# Patient Record
Sex: Male | Born: 1980 | Race: Black or African American | Hispanic: No | Marital: Single | Smoking: Current every day smoker
Health system: Southern US, Community
[De-identification: ages and names within clinical notes are randomized; demographics above are authoritative.]

---

## 2010-07-23 ENCOUNTER — Ambulatory Visit: Payer: Self-pay | Admitting: Internal Medicine

## 2010-08-19 ENCOUNTER — Ambulatory Visit: Payer: Self-pay | Admitting: Internal Medicine

## 2016-05-20 ENCOUNTER — Ambulatory Visit
Admission: EM | Admit: 2016-05-20 | Discharge: 2016-05-20 | Disposition: A | Discharge status: Home / Self Care | Payer: BC Managed Care – PPO | Attending: Family Medicine | Admitting: Family Medicine

## 2016-05-20 ENCOUNTER — Encounter: Payer: Self-pay | Admitting: Emergency Medicine

## 2016-05-20 ENCOUNTER — Ambulatory Visit (INDEPENDENT_AMBULATORY_CARE_PROVIDER_SITE_OTHER): Payer: BC Managed Care – PPO

## 2016-05-20 DIAGNOSIS — S9032XA Contusion of left foot, initial encounter: Secondary | ICD-10-CM | POA: Diagnosis not present

## 2016-05-20 NOTE — Discharge Instructions (Signed)
Use ice and elevation 20 minutes out of every 2 hours.23 times a day. Use boot for ambulation for activities but may come out of the boot for quiet times. If you're not improving in a week or two follow-up with the orthopedic surgeon

## 2016-05-20 NOTE — ED Triage Notes (Signed)
Patient states that he fell off a hover board and hurt his left foot today.

## 2016-05-20 NOTE — ED Provider Notes (Signed)
CSN: [id number]     Arrival date & time 05/20/16  1850 History   First MD Initiated Contact with Patient 05/20/16 2000     Chief Complaint  Patient presents with  . Foot Pain    left foot   (Consider location/radiation/quality/duration/timing/severity/associated sxs/prior Treatment) HPI  Sit 35 year old male who was using his son's hover board today when he lost control and fell onto his left lateral foot while wearing a boot. He said he's had difficulty walking since that period of time. His pain is over the lateral aspect of his foot indicating the base of his fifth metatarsal.     History reviewed. No pertinent past medical history. History reviewed. No pertinent surgical history. Family History  Problem Relation Age of Onset  . Healthy Mother   . Emphysema Father    Social History  Substance Use Topics  . Smoking status: Current Every Day Smoker  . Smokeless tobacco: Never Used  . Alcohol use Yes    Review of Systems  Constitutional: Positive for activity change. Negative for chills, fatigue and fever.  Musculoskeletal: Positive for arthralgias.  All other systems reviewed and are negative.   Allergies  Patient has no known allergies.  Home Medications   Prior to Admission medications   Medication Sig Start Date End Date Taking? Authorizing Provider  atorvastatin (LIPITOR) 10 MG tablet Take 10 mg by mouth daily.   Yes Historical Provider, MD   Meds Ordered and Administered this Visit  Medications - No data to display  BP (!) 143/97 (BP Location: Left Arm)   Pulse (!) 105   Temp 99.6 F (37.6 C) (Oral)   Resp 16   Ht 5\' 7"  (1.702 m)   Wt 197 lb (89.4 kg)   SpO2 100%   BMI 30.85 kg/m  No data found.   Physical Exam  Constitutional: He is oriented to person, place, and time. He appears well-developed and well-nourished. No distress.  HENT:  Head: Normocephalic and atraumatic.  Eyes: EOM are normal. Pupils are equal, round, and reactive to light.   Neck: Normal range of motion. Neck supple.  Musculoskeletal: Normal range of motion. He exhibits edema and tenderness. He exhibits no deformity.  Examination of his left foot and ankle shows good range of motion of the ankle dorsiflexion and plantar flexion and subtalar motion. Examination of the foot shows no significant contusion or erythema no skin break. There is some mild swelling at the base of the fifth metatarsal but is very minimal. Her nose is sharply localized over the base of the fifth metatarsal. There is no crepitus present. Is no malleolar pain there is no pain over the deltoid or lateral ligament complex. Is no pain or tenderness over the tarsal bones.  Neurological: He is alert and oriented to person, place, and time.  Skin: Skin is warm and dry. He is not diaphoretic.  Psychiatric: He has a normal mood and affect. His behavior is normal. Judgment and thought content normal.  Nursing note and vitals reviewed.   Urgent Care Course   Clinical Course     Procedures (including critical care time)  Labs Review Labs Reviewed - No data to display  Imaging Review Dg Foot Complete Left  Result Date: 05/20/2016 CLINICAL DATA:  Status post fall off hoverboard, with left lateral foot pain. Initial encounter. EXAM: LEFT FOOT - COMPLETE 3+ VIEW COMPARISON:  None. FINDINGS: There is no evidence of fracture or dislocation. The joint spaces are preserved. There is no evidence of  talar subluxation; the subtalar joint is unremarkable in appearance. A small os peroneum is noted. No significant soft tissue abnormalities are seen. IMPRESSION: 1. No evidence of fracture or dislocation. 2. Small os peroneum noted. Electronically Signed   By: Roanna RaiderJeffery  Chang M.D.   On: 05/20/2016 19:31     Visual Acuity Review  Right Eye Distance:   Left Eye Distance:   Bilateral Distance:    Right Eye Near:   Left Eye Near:    Bilateral Near:     Medications - No data to display Patient was provided  with a boot orthosis to allow him to ambulate quickly  without the need for crutches.   MDM   1. Contusion of left foot, initial encounter    Discharge Medication List as of 05/20/2016  8:27 PM    Plan: 1. Test/x-ray results and diagnosis reviewed with patient 2. rx as per orders; risks, benefits, potential side effects reviewed with patient 3. Recommend supportive treatment with ice and elevation to control swelling or discomfort. He was given a blue orthosis for active times and may come out of it during quiet times. Is not improving in a week or 2 should follow-up with an orthopedic surgeon. 4. F/u prn if symptoms worsen or don't improve     Lutricia FeilWilliam P Kizzi Overbey, PA-C 05/20/16 2034

## 2017-11-07 IMAGING — CR DG FOOT COMPLETE 3+V*L*
3 series · 3 of 3 positions shown · non-contrast
Comparison: None.

CLINICAL DATA: Status post fall off hoverboard, with left lateral
foot pain. Initial encounter.

EXAM:
LEFT FOOT - COMPLETE 3+ VIEW

[foot ap]
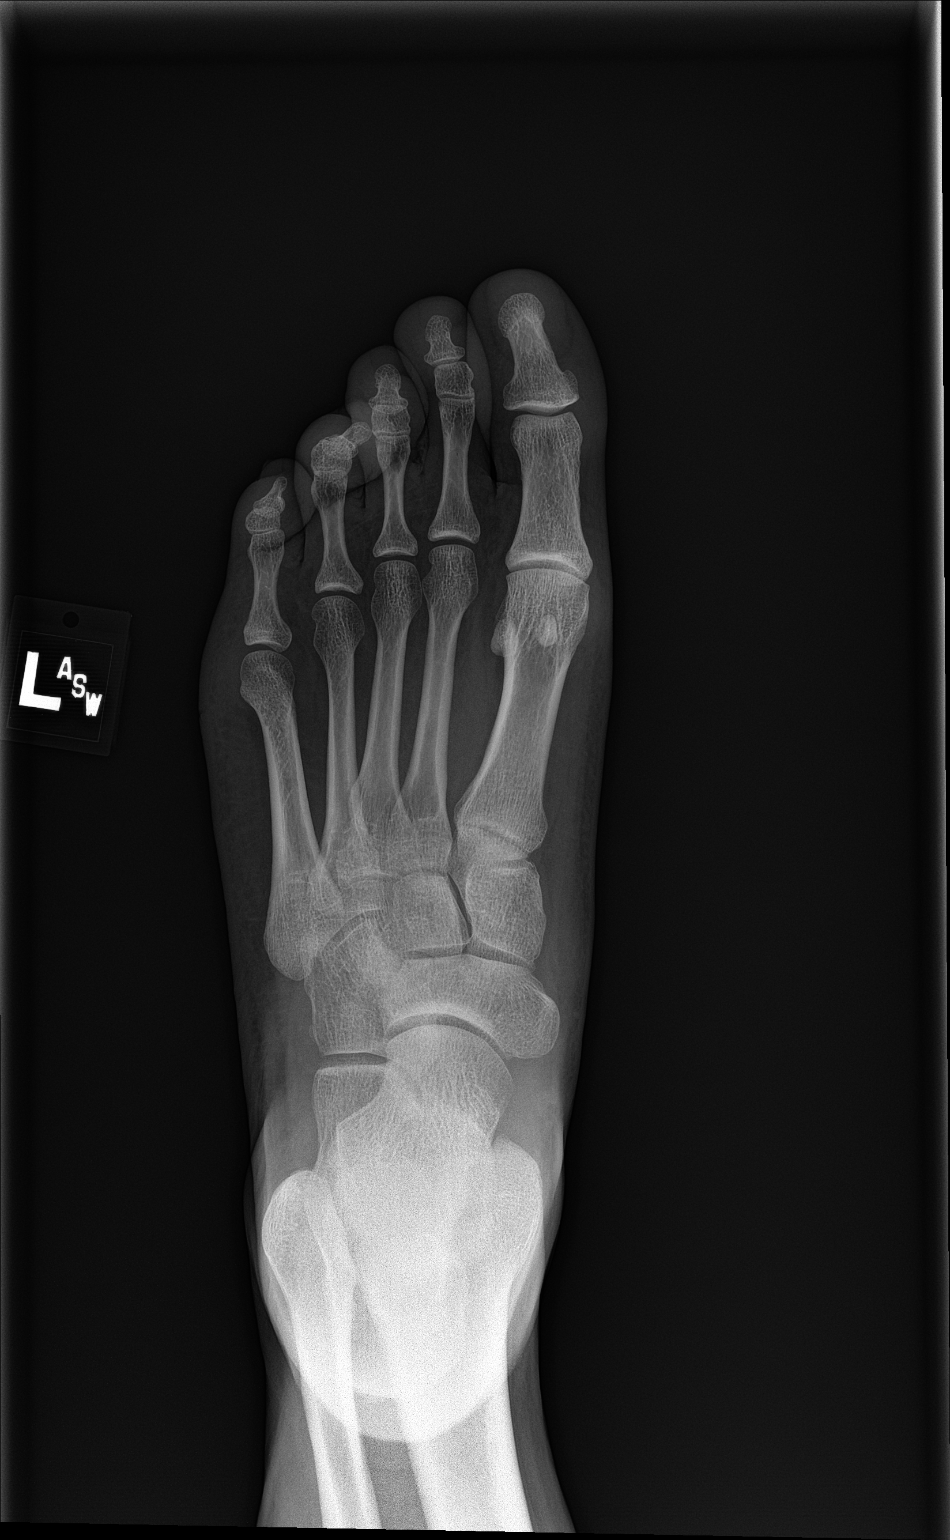

[foot obl]
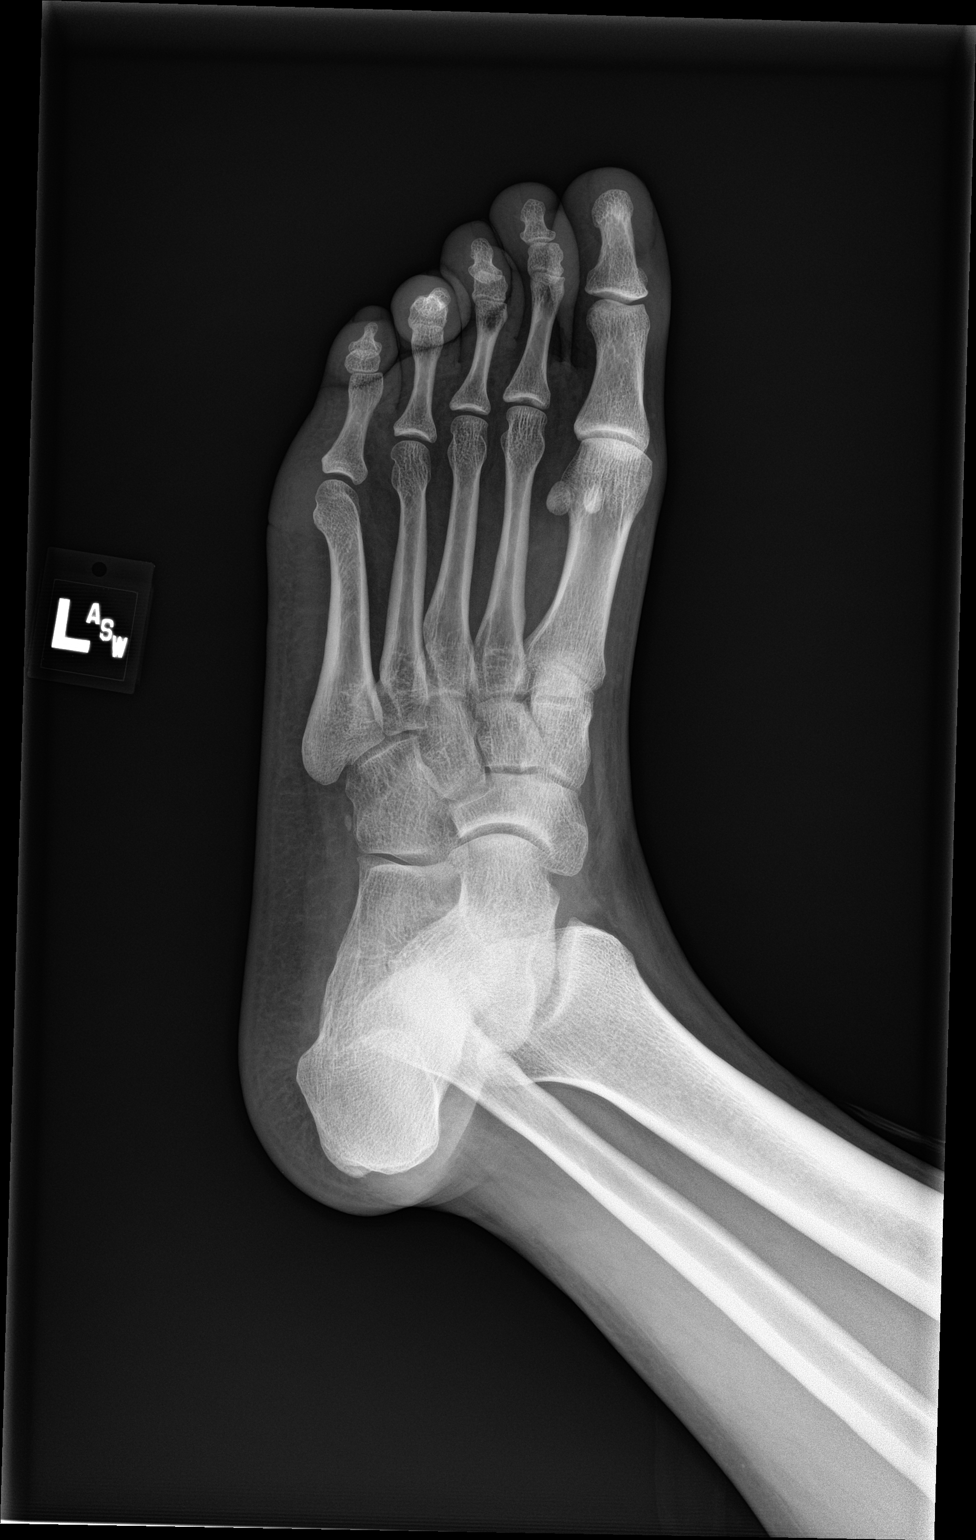

[foot lat]
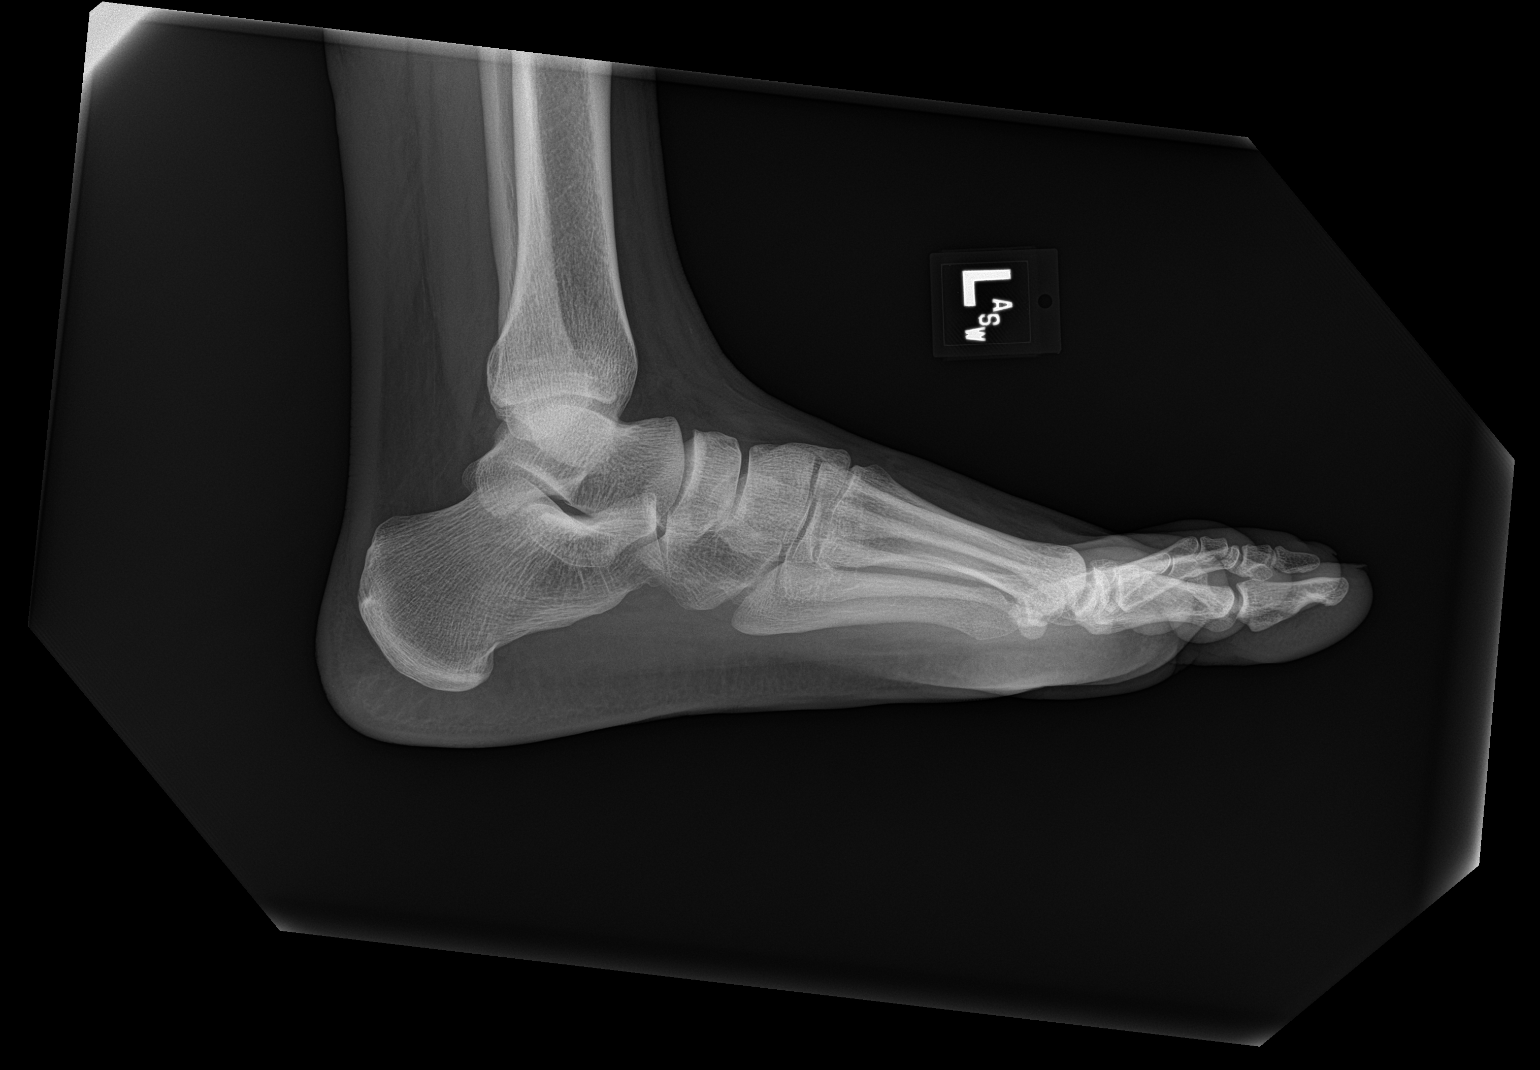

[3 of 3 positions shown; findings below may reference images not displayed]

FINDINGS: There is no evidence of fracture or dislocation. The joint spaces
are preserved. There is no evidence of talar subluxation; the
subtalar joint is unremarkable in appearance. A small os peroneum is
noted.

No significant soft tissue abnormalities are seen.
IMPRESSION: 1. No evidence of fracture or dislocation.
2. Small os peroneum noted.
